# Patient Record
Sex: Female | Born: 1945 | Race: White | Hispanic: No | Marital: Married | Smoking: Never smoker
Health system: Southern US, Community
[De-identification: ages and names within clinical notes are randomized; demographics above are authoritative.]

## PROBLEM LIST (undated history)

## (undated) DIAGNOSIS — B009 Herpesviral infection, unspecified: Secondary | ICD-10-CM

## (undated) DIAGNOSIS — I639 Cerebral infarction, unspecified: Secondary | ICD-10-CM

## (undated) DIAGNOSIS — IMO0002 Reserved for concepts with insufficient information to code with codable children: Secondary | ICD-10-CM

## (undated) HISTORY — PX: TUBAL LIGATION: SHX77

## (undated) HISTORY — PX: DILATION AND CURETTAGE OF UTERUS: SHX78

## (undated) HISTORY — DX: Cerebral infarction, unspecified: I63.9

## (undated) HISTORY — DX: Reserved for concepts with insufficient information to code with codable children: IMO0002

## (undated) HISTORY — DX: Herpesviral infection, unspecified: B00.9

---

## 1998-12-16 ENCOUNTER — Encounter: Admission: RE | Admit: 1998-12-16 | Discharge: 1998-12-16 | Payer: Self-pay | Admitting: Unknown Physician Specialty

## 1998-12-16 ENCOUNTER — Encounter: Payer: Self-pay | Admitting: Unknown Physician Specialty

## 2000-12-11 ENCOUNTER — Other Ambulatory Visit: Admission: RE | Admit: 2000-12-11 | Discharge: 2000-12-11 | Payer: Self-pay | Admitting: *Deleted

## 2002-04-28 DIAGNOSIS — I639 Cerebral infarction, unspecified: Secondary | ICD-10-CM

## 2002-04-28 HISTORY — DX: Cerebral infarction, unspecified: I63.9

## 2002-06-08 ENCOUNTER — Emergency Department (HOSPITAL_COMMUNITY): Admission: EM | Admit: 2002-06-08 | Discharge: 2002-06-08 | Payer: Self-pay | Admitting: Emergency Medicine

## 2002-06-08 ENCOUNTER — Encounter: Payer: Self-pay | Admitting: Emergency Medicine

## 2002-08-07 ENCOUNTER — Other Ambulatory Visit: Admission: RE | Admit: 2002-08-07 | Discharge: 2002-08-07 | Payer: Self-pay | Admitting: Obstetrics and Gynecology

## 2002-08-15 ENCOUNTER — Encounter: Payer: Self-pay | Admitting: Obstetrics and Gynecology

## 2002-08-15 ENCOUNTER — Encounter: Admission: RE | Admit: 2002-08-15 | Discharge: 2002-08-15 | Payer: Self-pay | Admitting: Obstetrics and Gynecology

## 2003-11-09 ENCOUNTER — Other Ambulatory Visit: Admission: RE | Admit: 2003-11-09 | Discharge: 2003-11-09 | Payer: Self-pay | Admitting: Obstetrics and Gynecology

## 2003-11-24 ENCOUNTER — Ambulatory Visit (HOSPITAL_COMMUNITY): Admission: RE | Admit: 2003-11-24 | Discharge: 2003-11-24 | Payer: Self-pay | Admitting: Obstetrics and Gynecology

## 2004-08-09 ENCOUNTER — Encounter: Admission: RE | Admit: 2004-08-09 | Discharge: 2004-08-09 | Payer: Self-pay | Admitting: Obstetrics and Gynecology

## 2005-04-27 ENCOUNTER — Other Ambulatory Visit: Admission: RE | Admit: 2005-04-27 | Discharge: 2005-04-27 | Payer: Self-pay | Admitting: Obstetrics & Gynecology

## 2006-09-11 ENCOUNTER — Other Ambulatory Visit: Admission: RE | Admit: 2006-09-11 | Discharge: 2006-09-11 | Payer: Self-pay | Admitting: Obstetrics & Gynecology

## 2006-09-13 ENCOUNTER — Encounter: Admission: RE | Admit: 2006-09-13 | Discharge: 2006-09-13 | Payer: Self-pay | Admitting: Obstetrics and Gynecology

## 2007-11-26 ENCOUNTER — Other Ambulatory Visit: Admission: RE | Admit: 2007-11-26 | Discharge: 2007-11-26 | Payer: Self-pay | Admitting: Obstetrics and Gynecology

## 2007-12-04 ENCOUNTER — Encounter: Admission: RE | Admit: 2007-12-04 | Discharge: 2007-12-04 | Payer: Self-pay | Admitting: Obstetrics and Gynecology

## 2010-01-06 ENCOUNTER — Encounter: Admission: RE | Admit: 2010-01-06 | Discharge: 2010-01-06 | Payer: Self-pay | Admitting: Family Medicine

## 2010-07-29 ENCOUNTER — Other Ambulatory Visit: Payer: Self-pay | Admitting: Family Medicine

## 2010-07-29 DIAGNOSIS — Z1231 Encounter for screening mammogram for malignant neoplasm of breast: Secondary | ICD-10-CM

## 2010-08-08 ENCOUNTER — Ambulatory Visit
Admission: RE | Admit: 2010-08-08 | Discharge: 2010-08-08 | Disposition: A | Discharge status: Home / Self Care | Payer: PRIVATE HEALTH INSURANCE | Source: Ambulatory Visit | Attending: Family Medicine | Admitting: Family Medicine

## 2010-08-08 DIAGNOSIS — Z1231 Encounter for screening mammogram for malignant neoplasm of breast: Secondary | ICD-10-CM

## 2010-08-08 LAB — HM DEXA SCAN

## 2011-12-07 ENCOUNTER — Other Ambulatory Visit: Payer: Self-pay | Admitting: Family Medicine

## 2011-12-07 DIAGNOSIS — M810 Age-related osteoporosis without current pathological fracture: Secondary | ICD-10-CM

## 2011-12-07 DIAGNOSIS — Z1231 Encounter for screening mammogram for malignant neoplasm of breast: Secondary | ICD-10-CM

## 2012-01-05 ENCOUNTER — Other Ambulatory Visit: Payer: PRIVATE HEALTH INSURANCE

## 2012-01-05 ENCOUNTER — Ambulatory Visit: Payer: PRIVATE HEALTH INSURANCE

## 2012-01-23 ENCOUNTER — Other Ambulatory Visit: Payer: PRIVATE HEALTH INSURANCE

## 2012-01-23 ENCOUNTER — Other Ambulatory Visit: Payer: Self-pay | Admitting: Family Medicine

## 2012-01-23 ENCOUNTER — Ambulatory Visit
Admission: RE | Admit: 2012-01-23 | Discharge: 2012-01-23 | Disposition: A | Discharge status: Home / Self Care | Payer: Medicare Other | Source: Ambulatory Visit | Attending: Family Medicine | Admitting: Family Medicine

## 2012-01-23 DIAGNOSIS — Z1231 Encounter for screening mammogram for malignant neoplasm of breast: Secondary | ICD-10-CM

## 2012-12-09 ENCOUNTER — Encounter: Payer: Self-pay | Admitting: Nurse Practitioner

## 2012-12-10 ENCOUNTER — Ambulatory Visit (INDEPENDENT_AMBULATORY_CARE_PROVIDER_SITE_OTHER): Payer: MEDICARE | Admitting: Nurse Practitioner

## 2012-12-10 ENCOUNTER — Encounter: Payer: Self-pay | Admitting: Nurse Practitioner

## 2012-12-10 VITALS — BP 110/66 | HR 64 | Resp 16 | Ht 62.75 in | Wt 132.0 lb

## 2012-12-10 DIAGNOSIS — M810 Age-related osteoporosis without current pathological fracture: Secondary | ICD-10-CM

## 2012-12-10 DIAGNOSIS — Z01419 Encounter for gynecological examination (general) (routine) without abnormal findings: Secondary | ICD-10-CM

## 2012-12-10 DIAGNOSIS — Z0000 Encounter for general adult medical examination without abnormal findings: Secondary | ICD-10-CM

## 2012-12-10 NOTE — Patient Instructions (Signed)
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
## 2012-12-11 ENCOUNTER — Telehealth: Payer: Self-pay | Admitting: Nurse Practitioner

## 2012-12-11 NOTE — Telephone Encounter (Signed)
Patient called at 430 pm and left message requesting call back today even though she knew we would close at 5pm.  Advised receptionist to tell patient we would call today even after 5pm.  Call to patient at 529pm. VM confirms correct number. LMTCB. [facility]. Becky York already has order for BMD in computer.

## 2012-12-11 NOTE — Telephone Encounter (Signed)
Patient calling to see whether she is due for a bone density or not and where she had her last one?

## 2012-12-12 NOTE — Progress Notes (Signed)
Encounter reviewed by Dr. Brook Silva.  

## 2012-12-12 NOTE — Telephone Encounter (Signed)
Name: Becky York, DESA MRN: [id number]  Date: 12/17/2012 Status: Sch  Time: 9:30 AM  Length: 30   Visit Type: DG DEXA [150200]  Copay: $0.00  Provider: Windy Carina Dx Dexa 1 Department: [facility]  Referring Provider: Lauro Franklin CSN: [id number]  Notes: ORDER REQUESTED 10/16 PF: 08/08/2010 [facility] NO NEEDS CR/PT [employer]/ [facility]   Made On: Change Notes:  12/12/2012 10:27 AM 12/12/2012 10:43 AM  By: By:  Drenda Freeze    Appointment Orders

## 2012-12-12 NOTE — Telephone Encounter (Signed)
Patient has appointment scheduled for DEXA that was made today 10/16 by patient.

## 2012-12-17 ENCOUNTER — Other Ambulatory Visit: Payer: Self-pay

## 2012-12-17 ENCOUNTER — Ambulatory Visit
Admission: RE | Admit: 2012-12-17 | Discharge: 2012-12-17 | Disposition: A | Discharge status: Home / Self Care | Payer: Medicare Other | Source: Ambulatory Visit | Attending: Nurse Practitioner | Admitting: Nurse Practitioner

## 2012-12-17 DIAGNOSIS — M810 Age-related osteoporosis without current pathological fracture: Secondary | ICD-10-CM

## 2012-12-17 DIAGNOSIS — Z1231 Encounter for screening mammogram for malignant neoplasm of breast: Secondary | ICD-10-CM

## 2012-12-31 ENCOUNTER — Telehealth: Payer: Self-pay | Admitting: *Deleted

## 2012-12-31 NOTE — Telephone Encounter (Signed)
I have attempted to contact this patient by phone with the following results: left message to return my call on answering machine (home).  

## 2012-12-31 NOTE — Telephone Encounter (Signed)
Message copied by Luisa Dago on Tue Dec 31, 2012  9:56 AM ------      Message from: Ria Comment R      Created: Mon Dec 30, 2012 10:19 AM       Let patient know that BMD did show no significant changes in the spine and left hip compared to 2009.  Since BMD in 2012 there was a slight loss at the left hip.  She needs to be sure and get more walking in. She will need to discuss this with Dr. Clarene Duke in January as planned. ------

## 2012-12-31 NOTE — Telephone Encounter (Signed)
Message copied by Alisa Graff on Tue Dec 31, 2012  3:20 PM ------      Message from: Ria Comment R      Created: Mon Dec 30, 2012 10:19 AM       Let patient know that BMD did show no significant changes in the spine and left hip compared to 2009.  Since BMD in 2012 there was a slight loss at the left hip.  She needs to be sure and get more walking in. She will need to discuss this with Dr. Clarene Duke in January as planned. ------

## 2012-12-31 NOTE — Telephone Encounter (Signed)
Patient returning call regarding BMD results and is very upset that our office has taken 2 weeks to call with these results when dr Little's office called her two days after test.  She is also very upset that we only called once and that Dr Fredirick Maudlin office called four times until they reached her.  She reports "she is not just a number, she is a person" and she doesn't like that we make her feel like a number.  She states she wants to continue to see Alexia Freestone but she doesn't trust our clinical staff and wants to let Dr Clarene Duke take care of her osteoporosis.  Acknowledged patient's frustration and apologized for delay in results.  Since patient has been a long time patient of [facility] and as she said, has not been made to feel like a number before, perhaps she could understand that our office is going through some changes and we are working to correct them and she could give clinical staff another chance. Then patient states this has happened once before in our old office (prior to 03/2008 which is when we moved here) and that she talked with Alexia Freestone about her concerns and Alexia Freestone took care of it.  Again apologized to patient and advised we are working on ways to improve the process.  Patient states she will see patty but will handle this with Dr Clarene Duke. Please see phone note from 10-15 and 12-12-12 where we previously responded to patient requests promptly.  Chart routed to [facility] and Dr Hyacinth Meeker for review.

## 2013-01-06 NOTE — Telephone Encounter (Signed)
Discussed with PG this encounter.  She has known patient for many years and states this is typical for patient.  Feels they have good relationship.  PG states we ordered this for patient as a convenience and that she was aware patient already knew results.  We just called as a follow-up courtesy.  Patient wasn't waiting on [place] for results.  PG will try not to order other tests for providers in future with this patient so we are not in similar situation in the future.  We want to try and meet all of our patient's expectations if possible.  PG aware we did not in this situation.  No follow-up needed.  Encounter closed.

## 2013-01-27 ENCOUNTER — Ambulatory Visit
Admission: RE | Admit: 2013-01-27 | Discharge: 2013-01-27 | Disposition: A | Discharge status: Home / Self Care | Payer: Medicare Other | Source: Ambulatory Visit

## 2013-01-27 DIAGNOSIS — Z1231 Encounter for screening mammogram for malignant neoplasm of breast: Secondary | ICD-10-CM

## 2013-10-10 DIAGNOSIS — J329 Chronic sinusitis, unspecified: Secondary | ICD-10-CM | POA: Diagnosis not present

## 2013-11-05 DIAGNOSIS — Z1211 Encounter for screening for malignant neoplasm of colon: Secondary | ICD-10-CM | POA: Diagnosis not present

## 2013-11-05 DIAGNOSIS — M810 Age-related osteoporosis without current pathological fracture: Secondary | ICD-10-CM | POA: Diagnosis not present

## 2013-11-05 DIAGNOSIS — Z23 Encounter for immunization: Secondary | ICD-10-CM | POA: Diagnosis not present

## 2013-11-05 DIAGNOSIS — Z87442 Personal history of urinary calculi: Secondary | ICD-10-CM | POA: Diagnosis not present

## 2013-11-05 DIAGNOSIS — Z0000 Encounter for general adult medical examination without abnormal findings: Secondary | ICD-10-CM | POA: Diagnosis not present

## 2013-11-05 DIAGNOSIS — E559 Vitamin D deficiency, unspecified: Secondary | ICD-10-CM | POA: Diagnosis not present

## 2013-11-05 DIAGNOSIS — Z79899 Other long term (current) drug therapy: Secondary | ICD-10-CM | POA: Diagnosis not present

## 2013-12-11 ENCOUNTER — Ambulatory Visit: Payer: MEDICARE | Admitting: Nurse Practitioner

## 2013-12-29 ENCOUNTER — Encounter: Payer: Self-pay | Admitting: Nurse Practitioner

## 2014-01-20 ENCOUNTER — Ambulatory Visit: Payer: MEDICARE | Admitting: Nurse Practitioner

## 2014-03-12 ENCOUNTER — Ambulatory Visit (INDEPENDENT_AMBULATORY_CARE_PROVIDER_SITE_OTHER): Payer: Medicare Other | Admitting: Nurse Practitioner

## 2014-03-12 ENCOUNTER — Encounter: Payer: Self-pay | Admitting: Nurse Practitioner

## 2014-03-12 VITALS — BP 116/72 | HR 60 | Ht 62.75 in | Wt 130.0 lb

## 2014-03-12 DIAGNOSIS — Z124 Encounter for screening for malignant neoplasm of cervix: Secondary | ICD-10-CM

## 2014-03-12 DIAGNOSIS — Z01419 Encounter for gynecological examination (general) (routine) without abnormal findings: Secondary | ICD-10-CM | POA: Diagnosis not present

## 2014-03-12 DIAGNOSIS — Z1211 Encounter for screening for malignant neoplasm of colon: Secondary | ICD-10-CM | POA: Diagnosis not present
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
## 2014-03-12 NOTE — Patient Instructions (Addendum)

## 2014-03-15 NOTE — Progress Notes (Signed)
Encounter reviewed by Dr. Janssen Zee Silva.  

## 2014-06-01 ENCOUNTER — Telehealth: Payer: Self-pay | Admitting: Nurse Practitioner

## 2014-06-01 NOTE — Telephone Encounter (Signed)
Pt says both breast are tender and swollen.

## 2014-06-01 NOTE — Telephone Encounter (Signed)
Spoke with patient. Patient states that last week she began to have bilateral breast tenderness. "I feel like it may be due to drinking too much caffeine. They are hurting off and on and they feel hard at the top of my breast. I also notice that they are swollen when I put my bra on." Advised patient will need to be seen for office visit with Milford Cage, FNP. Patient is agreeable. Appointment scheduled for tomorrow 4/5 at 10:30am. Agreeable to date and time.  Routing to provider for final review. Patient agreeable to disposition. Will close encounter

## 2014-06-02 ENCOUNTER — Ambulatory Visit (INDEPENDENT_AMBULATORY_CARE_PROVIDER_SITE_OTHER): Payer: Medicare Other | Admitting: Nurse Practitioner

## 2014-06-02 ENCOUNTER — Encounter: Payer: Self-pay | Admitting: Nurse Practitioner

## 2014-06-02 VITALS — BP 124/76 | HR 78 | Ht 62.75 in | Wt 130.0 lb

## 2014-06-02 DIAGNOSIS — N644 Mastodynia: Secondary | ICD-10-CM

## 2014-06-02 NOTE — Progress Notes (Signed)
Becky York  DOB: October 11, 2045  MRN: [id number]  Subjective:   69 y.o. Married white female G1P1 presents for evaluation of bilateral breast tenderness. Onset of the symptoms was about a month ago but recently noted an increase after doing yard work.  Patient sought evaluation because of breast tenderness.  Contributing factors include no family history of breast cancer. Denies anorexia, chills, fatigue, fevers and malaise. Patient denies history of trauma, bites, or injuries. Last mammogram was over a year ago.  Previous evaluation has included mammograms only.  She reports that she has had some increase emotional issues over husbands PTSD.  She has had a change in appetite but has tried to maintain hydration with increasing her caffeine.  Then she and husband did some outside work in the yard for 3 days with a lot of raking and pulling.  This caused the increase in pain upper breast.  She denies nipple discharge or mass.   Review of Systems A comprehensive review of systems was negative.   Objective:   General appearance: alert, cooperative, appears stated age and no distress, no rash Head: Normocephalic, without obvious abnormality, atraumatic Neck: no adenopathy and thyroid not enlarged, symmetric, no tenderness/mass/nodules Back: symmetric, no curvature. ROM normal. No CVA tenderness. Lungs: clear to auscultation bilaterally Breasts: normal appearance, no masses or tenderness, positive findings: fibrocystic changes and tenderness with both breast mostly upper outer quadrants. Heart: regular rate and rhythm Abdomen: normal findings: no masses palpable and no organomegaly    Assessment:   ASSESSMENT:Patient is diagnosed with mastalgia, likely due to fibrocystic changes   Plan:   PLAN: The patient has a documented plan to follow with further care of bilateral diagnostic mammogram with 3 D and [place] if needed on 06/04/14. 2. PLAN: FOLLOW as needed

## 2014-06-02 NOTE — Progress Notes (Signed)
Patient is scheduled for Bilateral Breast 3D Diagnostic Mammogram and bilateral  Breast Ultrasound at [facility] on 06/04/14 at 1300 . Patient agreeable to time/date/location.

## 2014-06-04 ENCOUNTER — Ambulatory Visit
Admission: RE | Admit: 2014-06-04 | Discharge: 2014-06-04 | Disposition: A | Discharge status: Home / Self Care | Payer: Medicare Other | Source: Ambulatory Visit | Attending: Nurse Practitioner | Admitting: Nurse Practitioner

## 2014-06-04 DIAGNOSIS — N644 Mastodynia: Secondary | ICD-10-CM

## 2014-06-04 DIAGNOSIS — N6459 Other signs and symptoms in breast: Secondary | ICD-10-CM | POA: Diagnosis not present

## 2014-06-07 NOTE — Progress Notes (Signed)
Encounter reviewed by Dr. Brook Silva.  

## 2014-07-07 DIAGNOSIS — M545 Low back pain: Secondary | ICD-10-CM | POA: Diagnosis not present

## 2014-07-07 DIAGNOSIS — R300 Dysuria: Secondary | ICD-10-CM | POA: Diagnosis not present

## 2014-07-07 DIAGNOSIS — R8290 Unspecified abnormal findings in urine: Secondary | ICD-10-CM | POA: Diagnosis not present

## 2014-11-13 DIAGNOSIS — Z87442 Personal history of urinary calculi: Secondary | ICD-10-CM | POA: Diagnosis not present

## 2014-11-13 DIAGNOSIS — M810 Age-related osteoporosis without current pathological fracture: Secondary | ICD-10-CM | POA: Diagnosis not present

## 2014-11-13 DIAGNOSIS — E559 Vitamin D deficiency, unspecified: Secondary | ICD-10-CM | POA: Diagnosis not present

## 2014-11-13 DIAGNOSIS — Z0000 Encounter for general adult medical examination without abnormal findings: Secondary | ICD-10-CM | POA: Diagnosis not present

## 2014-11-13 DIAGNOSIS — Z79899 Other long term (current) drug therapy: Secondary | ICD-10-CM | POA: Diagnosis not present

## 2014-11-13 DIAGNOSIS — Z23 Encounter for immunization: Secondary | ICD-10-CM | POA: Diagnosis not present

## 2014-12-19 ENCOUNTER — Other Ambulatory Visit: Payer: Self-pay | Admitting: Family Medicine

## 2014-12-19 DIAGNOSIS — M810 Age-related osteoporosis without current pathological fracture: Secondary | ICD-10-CM

## 2015-02-12 ENCOUNTER — Ambulatory Visit
Admission: RE | Admit: 2015-02-12 | Discharge: 2015-02-12 | Disposition: A | Discharge status: Home / Self Care | Payer: Medicare Other | Source: Ambulatory Visit | Attending: Family Medicine | Admitting: Family Medicine

## 2015-02-12 DIAGNOSIS — M810 Age-related osteoporosis without current pathological fracture: Secondary | ICD-10-CM

## 2015-04-01 DIAGNOSIS — H04123 Dry eye syndrome of bilateral lacrimal glands: Secondary | ICD-10-CM | POA: Diagnosis not present

## 2015-04-01 DIAGNOSIS — H40033 Anatomical narrow angle, bilateral: Secondary | ICD-10-CM | POA: Diagnosis not present

## 2015-04-07 ENCOUNTER — Ambulatory Visit (INDEPENDENT_AMBULATORY_CARE_PROVIDER_SITE_OTHER): Payer: Medicare Other | Admitting: Nurse Practitioner

## 2015-04-07 ENCOUNTER — Encounter: Payer: Self-pay | Admitting: Nurse Practitioner

## 2015-04-07 VITALS — BP 100/70 | HR 76 | Resp 14 | Ht 63.5 in | Wt 130.6 lb

## 2015-04-07 DIAGNOSIS — Z124 Encounter for screening for malignant neoplasm of cervix: Secondary | ICD-10-CM | POA: Diagnosis not present

## 2015-04-07 DIAGNOSIS — Z01419 Encounter for gynecological examination (general) (routine) without abnormal findings: Secondary | ICD-10-CM

## 2015-04-07 DIAGNOSIS — Z0000 Encounter for general adult medical examination without abnormal findings: Secondary | ICD-10-CM

## 2015-04-07 DIAGNOSIS — E559 Vitamin D deficiency, unspecified: Secondary | ICD-10-CM

## 2015-04-07 NOTE — Progress Notes (Signed)
Encounter reviewed by Dr. Brook Amundson C. Silva.  
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
## 2015-04-07 NOTE — Patient Instructions (Signed)

## 2015-04-08 LAB — HEPATITIS C ANTIBODY: HCV Ab: NEGATIVE

## 2015-04-08 LAB — VITAMIN D 25 HYDROXY (VIT D DEFICIENCY, FRACTURES): Vit D, 25-Hydroxy: 31 ng/mL (ref 30–100)

## 2015-04-12 DIAGNOSIS — Z1211 Encounter for screening for malignant neoplasm of colon: Secondary | ICD-10-CM | POA: Diagnosis not present

## 2015-04-12 DIAGNOSIS — Z1212 Encounter for screening for malignant neoplasm of rectum: Secondary | ICD-10-CM | POA: Diagnosis not present

## 2015-04-26 ENCOUNTER — Telehealth: Payer: Self-pay | Admitting: Nurse Practitioner

## 2015-04-26 NOTE — Telephone Encounter (Signed)
LMTCB about insurance card

## 2015-05-28 DIAGNOSIS — R195 Other fecal abnormalities: Secondary | ICD-10-CM | POA: Diagnosis not present

## 2015-07-09 DIAGNOSIS — K5730 Diverticulosis of large intestine without perforation or abscess without bleeding: Secondary | ICD-10-CM | POA: Diagnosis not present

## 2015-07-09 DIAGNOSIS — D126 Benign neoplasm of colon, unspecified: Secondary | ICD-10-CM | POA: Diagnosis not present

## 2015-07-09 DIAGNOSIS — D122 Benign neoplasm of ascending colon: Secondary | ICD-10-CM | POA: Diagnosis not present

## 2015-07-09 DIAGNOSIS — R195 Other fecal abnormalities: Secondary | ICD-10-CM | POA: Diagnosis not present

## 2015-07-09 DIAGNOSIS — D125 Benign neoplasm of sigmoid colon: Secondary | ICD-10-CM | POA: Diagnosis not present

## 2015-12-15 DIAGNOSIS — Z87442 Personal history of urinary calculi: Secondary | ICD-10-CM | POA: Diagnosis not present

## 2015-12-15 DIAGNOSIS — R899 Unspecified abnormal finding in specimens from other organs, systems and tissues: Secondary | ICD-10-CM | POA: Diagnosis not present

## 2015-12-15 DIAGNOSIS — Z1389 Encounter for screening for other disorder: Secondary | ICD-10-CM | POA: Diagnosis not present

## 2015-12-15 DIAGNOSIS — E559 Vitamin D deficiency, unspecified: Secondary | ICD-10-CM | POA: Diagnosis not present

## 2015-12-15 DIAGNOSIS — Z79899 Other long term (current) drug therapy: Secondary | ICD-10-CM | POA: Diagnosis not present

## 2015-12-15 DIAGNOSIS — Z86010 Personal history of colonic polyps: Secondary | ICD-10-CM | POA: Diagnosis not present

## 2015-12-15 DIAGNOSIS — Z0000 Encounter for general adult medical examination without abnormal findings: Secondary | ICD-10-CM | POA: Diagnosis not present

## 2015-12-15 DIAGNOSIS — M810 Age-related osteoporosis without current pathological fracture: Secondary | ICD-10-CM | POA: Diagnosis not present

## 2015-12-15 DIAGNOSIS — Z23 Encounter for immunization: Secondary | ICD-10-CM | POA: Diagnosis not present

## 2016-04-07 ENCOUNTER — Ambulatory Visit: Payer: Medicare Other | Admitting: Nurse Practitioner

## 2016-04-10 DIAGNOSIS — H40033 Anatomical narrow angle, bilateral: Secondary | ICD-10-CM | POA: Diagnosis not present

## 2016-04-10 DIAGNOSIS — H2513 Age-related nuclear cataract, bilateral: Secondary | ICD-10-CM | POA: Diagnosis not present

## 2016-06-16 DIAGNOSIS — R071 Chest pain on breathing: Secondary | ICD-10-CM | POA: Diagnosis not present

## 2016-06-20 ENCOUNTER — Other Ambulatory Visit: Payer: Self-pay | Admitting: Family Medicine

## 2016-06-20 DIAGNOSIS — Z1231 Encounter for screening mammogram for malignant neoplasm of breast: Secondary | ICD-10-CM

## 2016-07-05 ENCOUNTER — Ambulatory Visit
Admission: RE | Admit: 2016-07-05 | Discharge: 2016-07-05 | Disposition: A | Discharge status: Home / Self Care | Payer: Medicare Other | Source: Ambulatory Visit | Attending: Family Medicine | Admitting: Family Medicine

## 2016-07-05 DIAGNOSIS — Z1231 Encounter for screening mammogram for malignant neoplasm of breast: Secondary | ICD-10-CM | POA: Diagnosis not present

## 2016-09-25 DIAGNOSIS — M546 Pain in thoracic spine: Secondary | ICD-10-CM | POA: Diagnosis not present

## 2016-10-02 ENCOUNTER — Ambulatory Visit
Admission: RE | Admit: 2016-10-02 | Discharge: 2016-10-02 | Disposition: A | Discharge status: Home / Self Care | Payer: Medicare Other | Source: Ambulatory Visit | Attending: Family Medicine | Admitting: Family Medicine

## 2016-10-02 ENCOUNTER — Other Ambulatory Visit: Payer: Self-pay | Admitting: Family Medicine

## 2016-10-02 DIAGNOSIS — M546 Pain in thoracic spine: Principal | ICD-10-CM

## 2016-10-02 DIAGNOSIS — M47814 Spondylosis without myelopathy or radiculopathy, thoracic region: Secondary | ICD-10-CM | POA: Diagnosis not present

## 2016-10-02 DIAGNOSIS — G8929 Other chronic pain: Secondary | ICD-10-CM

## 2016-10-03 ENCOUNTER — Other Ambulatory Visit: Payer: Self-pay | Admitting: Family Medicine

## 2016-10-03 DIAGNOSIS — M8580 Other specified disorders of bone density and structure, unspecified site: Secondary | ICD-10-CM

## 2016-10-03 DIAGNOSIS — M810 Age-related osteoporosis without current pathological fracture: Secondary | ICD-10-CM

## 2016-11-30 DIAGNOSIS — M62830 Muscle spasm of back: Secondary | ICD-10-CM | POA: Diagnosis not present

## 2016-12-21 DIAGNOSIS — Z23 Encounter for immunization: Secondary | ICD-10-CM | POA: Diagnosis not present

## 2016-12-21 DIAGNOSIS — Z87442 Personal history of urinary calculi: Secondary | ICD-10-CM | POA: Diagnosis not present

## 2016-12-21 DIAGNOSIS — Z0000 Encounter for general adult medical examination without abnormal findings: Secondary | ICD-10-CM | POA: Diagnosis not present

## 2016-12-21 DIAGNOSIS — Z79899 Other long term (current) drug therapy: Secondary | ICD-10-CM | POA: Diagnosis not present

## 2016-12-21 DIAGNOSIS — Z86010 Personal history of colonic polyps: Secondary | ICD-10-CM | POA: Diagnosis not present

## 2016-12-21 DIAGNOSIS — E559 Vitamin D deficiency, unspecified: Secondary | ICD-10-CM | POA: Diagnosis not present

## 2016-12-21 DIAGNOSIS — M810 Age-related osteoporosis without current pathological fracture: Secondary | ICD-10-CM | POA: Diagnosis not present

## 2017-02-13 ENCOUNTER — Other Ambulatory Visit: Payer: Medicare Other

## 2017-05-09 DIAGNOSIS — M810 Age-related osteoporosis without current pathological fracture: Secondary | ICD-10-CM | POA: Diagnosis not present

## 2017-05-09 DIAGNOSIS — M545 Low back pain: Secondary | ICD-10-CM | POA: Diagnosis not present

## 2017-05-11 DIAGNOSIS — M549 Dorsalgia, unspecified: Secondary | ICD-10-CM | POA: Diagnosis not present

## 2017-06-20 ENCOUNTER — Other Ambulatory Visit: Payer: Self-pay | Admitting: Family Medicine

## 2017-06-20 DIAGNOSIS — Z139 Encounter for screening, unspecified: Secondary | ICD-10-CM

## 2017-10-02 ENCOUNTER — Other Ambulatory Visit: Payer: Self-pay | Admitting: Family Medicine

## 2017-10-02 DIAGNOSIS — M810 Age-related osteoporosis without current pathological fracture: Secondary | ICD-10-CM

## 2017-10-16 DIAGNOSIS — M545 Low back pain: Secondary | ICD-10-CM | POA: Diagnosis not present

## 2018-01-11 DIAGNOSIS — Z79899 Other long term (current) drug therapy: Secondary | ICD-10-CM | POA: Diagnosis not present

## 2018-01-11 DIAGNOSIS — M810 Age-related osteoporosis without current pathological fracture: Secondary | ICD-10-CM | POA: Diagnosis not present

## 2018-01-11 DIAGNOSIS — Z0000 Encounter for general adult medical examination without abnormal findings: Secondary | ICD-10-CM | POA: Diagnosis not present

## 2018-01-11 DIAGNOSIS — N289 Disorder of kidney and ureter, unspecified: Secondary | ICD-10-CM | POA: Diagnosis not present

## 2018-01-11 DIAGNOSIS — Z86010 Personal history of colonic polyps: Secondary | ICD-10-CM | POA: Diagnosis not present

## 2018-01-11 DIAGNOSIS — Z23 Encounter for immunization: Secondary | ICD-10-CM | POA: Diagnosis not present

## 2018-01-11 DIAGNOSIS — M899 Disorder of bone, unspecified: Secondary | ICD-10-CM | POA: Diagnosis not present

## 2018-03-19 DIAGNOSIS — R0789 Other chest pain: Secondary | ICD-10-CM | POA: Diagnosis not present

## 2018-03-19 DIAGNOSIS — S29011A Strain of muscle and tendon of front wall of thorax, initial encounter: Secondary | ICD-10-CM | POA: Diagnosis not present

## 2018-03-25 DIAGNOSIS — S41102A Unspecified open wound of left upper arm, initial encounter: Secondary | ICD-10-CM | POA: Diagnosis not present

## 2018-06-18 IMAGING — CR DG THORACIC SPINE 3V
3 series · 3 of 3 positions shown · non-contrast
Comparison: 02/14/2012

CLINICAL DATA: Thoracic back pain

EXAM:
THORACIC SPINE - 3 VIEWS

[t t-spine a.p.]
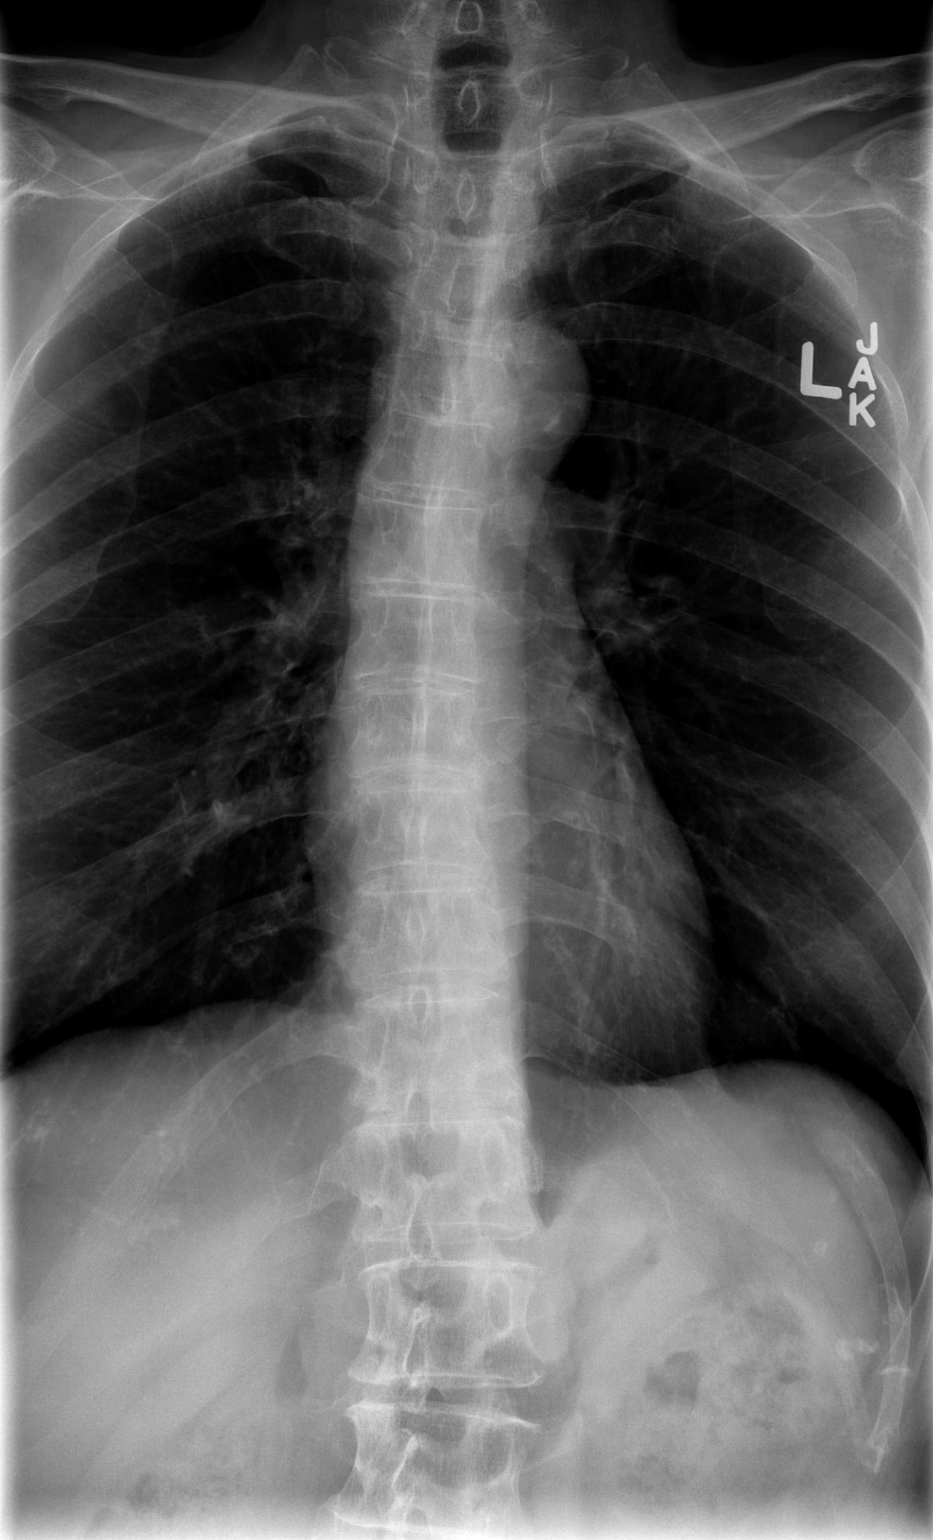

[t t-spine lat *]
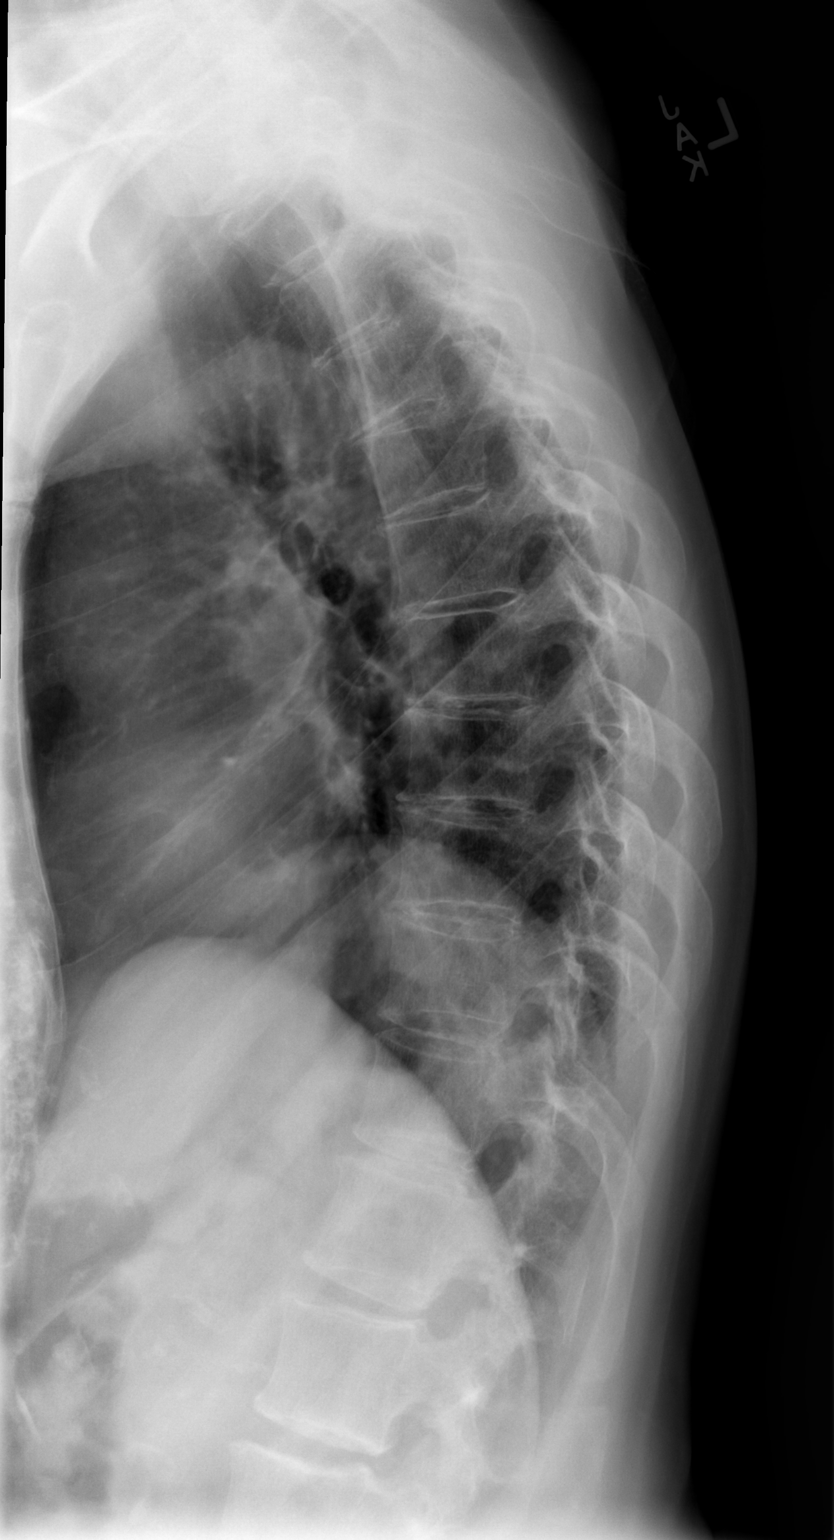

[t swimmers]
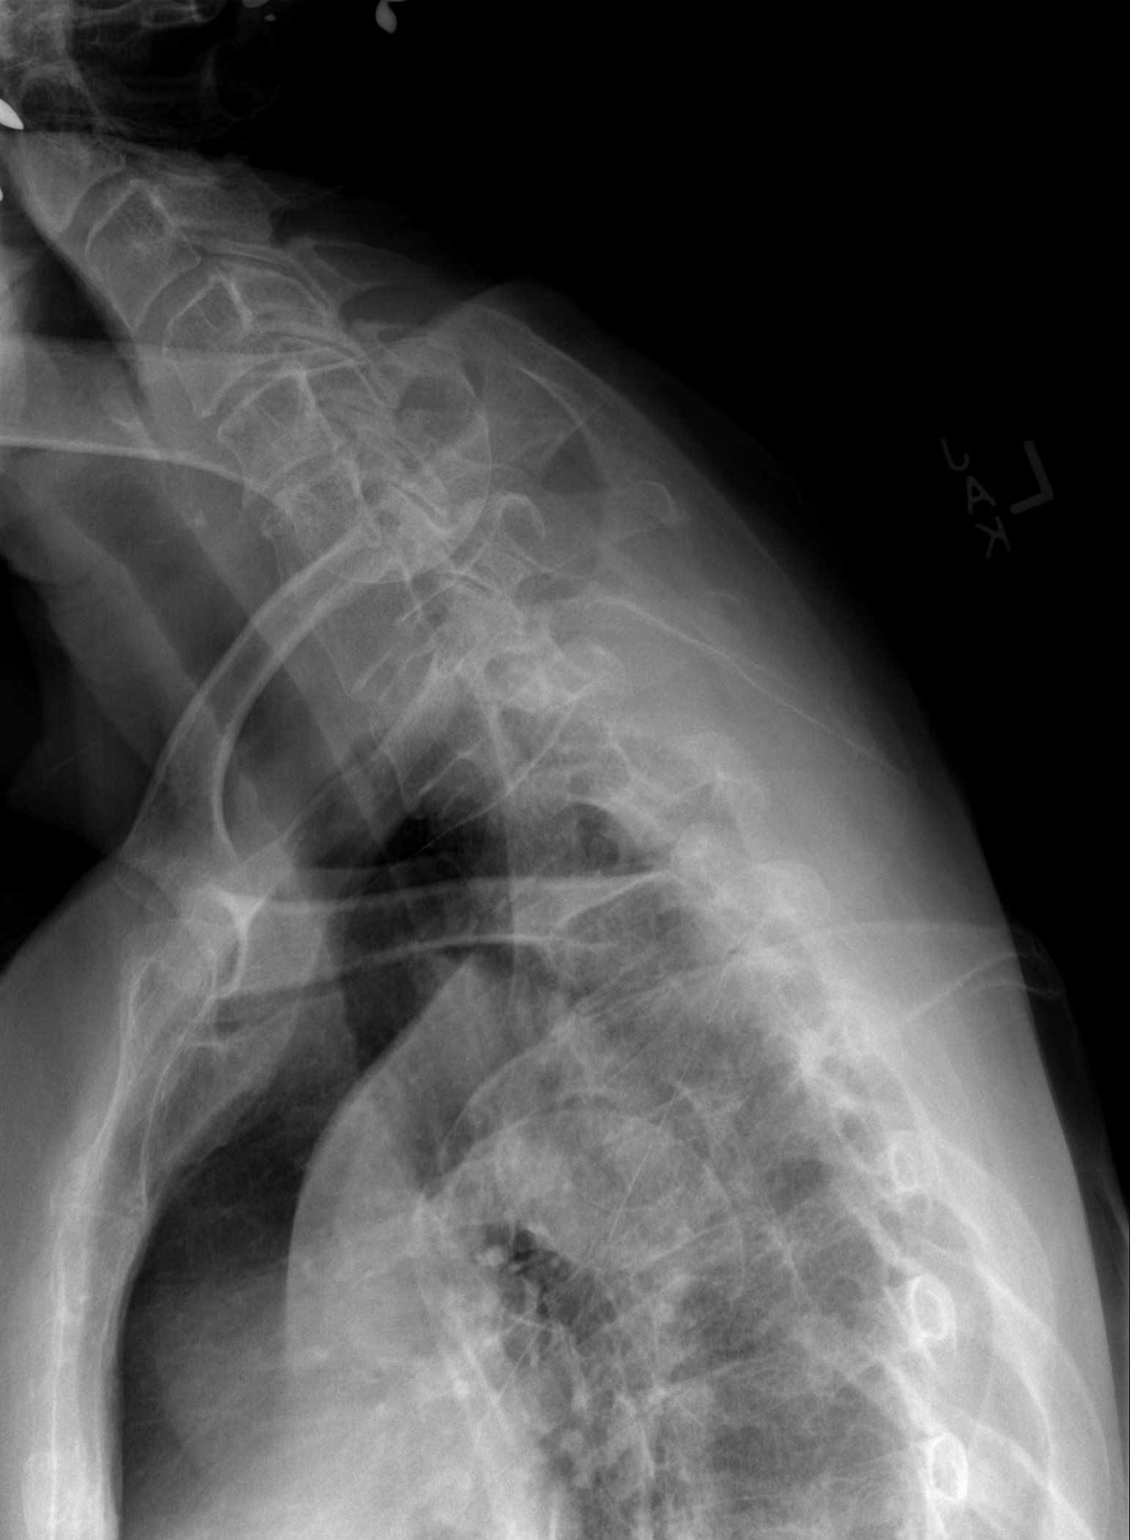

[3 of 3 positions shown; findings below may reference images not displayed]

FINDINGS: There is slight dextroscoliosis of the midthoracic spine there is no
vertebral compression deformity. Mild spondylitic changes in the
midthoracic spine. No definite fracture.
IMPRESSION: No acute bony pathology.

## 2018-06-27 DIAGNOSIS — S29012A Strain of muscle and tendon of back wall of thorax, initial encounter: Secondary | ICD-10-CM | POA: Diagnosis not present

## 2018-11-18 DIAGNOSIS — Z23 Encounter for immunization: Secondary | ICD-10-CM | POA: Diagnosis not present

## 2019-04-24 ENCOUNTER — Ambulatory Visit: Payer: Medicare Other | Attending: Internal Medicine

## 2019-04-24 DIAGNOSIS — Z23 Encounter for immunization: Secondary | ICD-10-CM | POA: Insufficient documentation

## 2019-04-24 NOTE — Progress Notes (Signed)
   Covid-19 Vaccination Clinic  Name:  Becky York    MRN: [id number] DOB: 27-May-1945  04/24/2019  [name] was observed post Covid-19 immunization for 15 minutes without incidence. She was provided with Vaccine Information Sheet and instruction to access the V-Safe system.   [name] was instructed to call 911 with any severe reactions post vaccine: Marland Kitchen Difficulty breathing  . Swelling of your face and throat  . A fast heartbeat  . A bad rash all over your body  . Dizziness and weakness    Immunizations Administered    Name Date Dose VIS Date Route   Pfizer COVID-19 Vaccine 04/24/2019  8:19 AM 0.3 mL 02/07/2019 Intramuscular   Manufacturer: [place]   Lot: [id number]   [place]: [id number]

## 2019-05-14 ENCOUNTER — Ambulatory Visit: Payer: Medicare Other | Attending: Internal Medicine

## 2019-05-14 DIAGNOSIS — Z23 Encounter for immunization: Secondary | ICD-10-CM

## 2019-05-14 NOTE — Progress Notes (Signed)
   Covid-19 Vaccination Clinic  Name:  Becky York    MRN: [id number] DOB: [birth year]  05/14/2019  [name] was observed post Covid-19 immunization for 15 minutes without incident. She was provided with Vaccine Information Sheet and instruction to access the V-Safe system.   [name] was instructed to call 911 with any severe reactions post vaccine: Marland Kitchen Difficulty breathing  . Swelling of face and throat  . A fast heartbeat  . A bad rash all over body  . Dizziness and weakness   Immunizations Administered    Name Date Dose VIS Date Route   Pfizer COVID-19 Vaccine 05/14/2019 10:00 AM 0.3 mL 02/07/2019 Intramuscular   Manufacturer: [place]   Lot: [id number]   [place]: [id number]

## 2019-11-17 DIAGNOSIS — M545 Low back pain: Secondary | ICD-10-CM | POA: Diagnosis not present

## 2019-11-17 DIAGNOSIS — R300 Dysuria: Secondary | ICD-10-CM | POA: Diagnosis not present

## 2020-01-02 DIAGNOSIS — Z23 Encounter for immunization: Secondary | ICD-10-CM | POA: Diagnosis not present

## 2020-02-26 DIAGNOSIS — Z23 Encounter for immunization: Secondary | ICD-10-CM | POA: Diagnosis not present

## 2020-03-10 DIAGNOSIS — E7800 Pure hypercholesterolemia, unspecified: Secondary | ICD-10-CM | POA: Diagnosis not present

## 2020-03-10 DIAGNOSIS — N183 Chronic kidney disease, stage 3 unspecified: Secondary | ICD-10-CM | POA: Diagnosis not present

## 2020-03-10 DIAGNOSIS — Z0000 Encounter for general adult medical examination without abnormal findings: Secondary | ICD-10-CM | POA: Diagnosis not present

## 2020-03-10 DIAGNOSIS — Z79899 Other long term (current) drug therapy: Secondary | ICD-10-CM | POA: Diagnosis not present

## 2020-03-10 DIAGNOSIS — Z86010 Personal history of colonic polyps: Secondary | ICD-10-CM | POA: Diagnosis not present

## 2020-03-10 DIAGNOSIS — M899 Disorder of bone, unspecified: Secondary | ICD-10-CM | POA: Diagnosis not present

## 2020-03-10 DIAGNOSIS — Z87442 Personal history of urinary calculi: Secondary | ICD-10-CM | POA: Diagnosis not present

## 2020-03-10 DIAGNOSIS — R8290 Unspecified abnormal findings in urine: Secondary | ICD-10-CM | POA: Diagnosis not present

## 2020-03-10 DIAGNOSIS — M810 Age-related osteoporosis without current pathological fracture: Secondary | ICD-10-CM | POA: Diagnosis not present

## 2020-03-10 DIAGNOSIS — M549 Dorsalgia, unspecified: Secondary | ICD-10-CM | POA: Diagnosis not present

## 2020-03-16 ENCOUNTER — Other Ambulatory Visit: Payer: Self-pay | Admitting: Family Medicine

## 2020-03-16 DIAGNOSIS — M810 Age-related osteoporosis without current pathological fracture: Secondary | ICD-10-CM

## 2020-03-16 DIAGNOSIS — Z1231 Encounter for screening mammogram for malignant neoplasm of breast: Secondary | ICD-10-CM

## 2020-06-08 DIAGNOSIS — M4316 Spondylolisthesis, lumbar region: Secondary | ICD-10-CM | POA: Diagnosis not present

## 2020-06-08 DIAGNOSIS — M546 Pain in thoracic spine: Secondary | ICD-10-CM | POA: Diagnosis not present

## 2020-06-08 DIAGNOSIS — M47816 Spondylosis without myelopathy or radiculopathy, lumbar region: Secondary | ICD-10-CM | POA: Diagnosis not present

## 2020-06-08 DIAGNOSIS — M47814 Spondylosis without myelopathy or radiculopathy, thoracic region: Secondary | ICD-10-CM | POA: Diagnosis not present

## 2020-06-08 DIAGNOSIS — M4150 Other secondary scoliosis, site unspecified: Secondary | ICD-10-CM | POA: Diagnosis not present

## 2020-07-12 ENCOUNTER — Ambulatory Visit
Admission: RE | Admit: 2020-07-12 | Discharge: 2020-07-12 | Disposition: A | Discharge status: Home / Self Care | Payer: Medicare Other | Source: Ambulatory Visit | Attending: Family Medicine | Admitting: Family Medicine

## 2020-07-12 ENCOUNTER — Other Ambulatory Visit: Payer: Self-pay

## 2020-07-12 DIAGNOSIS — M810 Age-related osteoporosis without current pathological fracture: Secondary | ICD-10-CM

## 2020-07-12 DIAGNOSIS — Z780 Asymptomatic menopausal state: Secondary | ICD-10-CM | POA: Diagnosis not present

## 2020-07-12 DIAGNOSIS — Z1231 Encounter for screening mammogram for malignant neoplasm of breast: Secondary | ICD-10-CM

## 2020-07-13 ENCOUNTER — Other Ambulatory Visit: Payer: Self-pay | Admitting: *Deleted

## 2020-07-13 ENCOUNTER — Other Ambulatory Visit: Payer: Self-pay | Admitting: Family Medicine

## 2020-07-13 DIAGNOSIS — R928 Other abnormal and inconclusive findings on diagnostic imaging of breast: Secondary | ICD-10-CM

## 2020-07-22 DIAGNOSIS — R0989 Other specified symptoms and signs involving the circulatory and respiratory systems: Secondary | ICD-10-CM | POA: Diagnosis not present

## 2020-07-22 DIAGNOSIS — J040 Acute laryngitis: Secondary | ICD-10-CM | POA: Diagnosis not present

## 2020-07-22 DIAGNOSIS — Z03818 Encounter for observation for suspected exposure to other biological agents ruled out: Secondary | ICD-10-CM | POA: Diagnosis not present

## 2020-07-22 DIAGNOSIS — J029 Acute pharyngitis, unspecified: Secondary | ICD-10-CM | POA: Diagnosis not present

## 2020-07-22 DIAGNOSIS — R059 Cough, unspecified: Secondary | ICD-10-CM | POA: Diagnosis not present

## 2020-08-02 ENCOUNTER — Other Ambulatory Visit: Payer: Medicare Other

## 2020-08-19 ENCOUNTER — Ambulatory Visit
Admission: RE | Admit: 2020-08-19 | Discharge: 2020-08-19 | Disposition: A | Discharge status: Home / Self Care | Payer: Medicare Other | Source: Ambulatory Visit | Attending: Family Medicine | Admitting: Family Medicine

## 2020-08-19 ENCOUNTER — Other Ambulatory Visit: Payer: Self-pay

## 2020-08-19 DIAGNOSIS — R928 Other abnormal and inconclusive findings on diagnostic imaging of breast: Secondary | ICD-10-CM

## 2020-08-19 DIAGNOSIS — N6489 Other specified disorders of breast: Secondary | ICD-10-CM | POA: Diagnosis not present

## 2020-08-19 DIAGNOSIS — R922 Inconclusive mammogram: Secondary | ICD-10-CM | POA: Diagnosis not present

## 2020-12-13 DIAGNOSIS — Z6822 Body mass index (BMI) 22.0-22.9, adult: Secondary | ICD-10-CM | POA: Diagnosis not present

## 2020-12-13 DIAGNOSIS — N390 Urinary tract infection, site not specified: Secondary | ICD-10-CM | POA: Diagnosis not present

## 2020-12-13 DIAGNOSIS — M810 Age-related osteoporosis without current pathological fracture: Secondary | ICD-10-CM | POA: Diagnosis not present

## 2021-01-14 DIAGNOSIS — R030 Elevated blood-pressure reading, without diagnosis of hypertension: Secondary | ICD-10-CM | POA: Diagnosis not present

## 2021-01-14 DIAGNOSIS — N1831 Chronic kidney disease, stage 3a: Secondary | ICD-10-CM | POA: Diagnosis not present

## 2021-01-14 DIAGNOSIS — H6980 Other specified disorders of Eustachian tube, unspecified ear: Secondary | ICD-10-CM | POA: Diagnosis not present

## 2021-01-14 DIAGNOSIS — M549 Dorsalgia, unspecified: Secondary | ICD-10-CM | POA: Diagnosis not present

## 2021-02-02 DIAGNOSIS — Z23 Encounter for immunization: Secondary | ICD-10-CM | POA: Diagnosis not present

## 2021-03-04 DIAGNOSIS — R300 Dysuria: Secondary | ICD-10-CM | POA: Diagnosis not present

## 2021-03-17 DIAGNOSIS — Z0000 Encounter for general adult medical examination without abnormal findings: Secondary | ICD-10-CM | POA: Diagnosis not present

## 2021-03-17 DIAGNOSIS — N1831 Chronic kidney disease, stage 3a: Secondary | ICD-10-CM | POA: Diagnosis not present

## 2021-03-17 DIAGNOSIS — E7800 Pure hypercholesterolemia, unspecified: Secondary | ICD-10-CM | POA: Diagnosis not present

## 2021-03-17 DIAGNOSIS — M810 Age-related osteoporosis without current pathological fracture: Secondary | ICD-10-CM | POA: Diagnosis not present

## 2021-04-18 DIAGNOSIS — H40033 Anatomical narrow angle, bilateral: Secondary | ICD-10-CM | POA: Diagnosis not present

## 2021-04-18 DIAGNOSIS — H00021 Hordeolum internum right upper eyelid: Secondary | ICD-10-CM | POA: Diagnosis not present

## 2021-12-29 DIAGNOSIS — H00021 Hordeolum internum right upper eyelid: Secondary | ICD-10-CM | POA: Diagnosis not present

## 2021-12-29 DIAGNOSIS — R051 Acute cough: Secondary | ICD-10-CM | POA: Diagnosis not present

## 2021-12-29 DIAGNOSIS — Z20822 Contact with and (suspected) exposure to covid-19: Secondary | ICD-10-CM | POA: Diagnosis not present

## 2021-12-29 DIAGNOSIS — J029 Acute pharyngitis, unspecified: Secondary | ICD-10-CM | POA: Diagnosis not present

## 2022-03-21 DIAGNOSIS — H2513 Age-related nuclear cataract, bilateral: Secondary | ICD-10-CM | POA: Diagnosis not present

## 2022-03-21 DIAGNOSIS — H01009 Unspecified blepharitis unspecified eye, unspecified eyelid: Secondary | ICD-10-CM | POA: Diagnosis not present

## 2022-03-21 DIAGNOSIS — H16293 Other keratoconjunctivitis, bilateral: Secondary | ICD-10-CM | POA: Diagnosis not present

## 2022-03-22 DIAGNOSIS — Z6822 Body mass index (BMI) 22.0-22.9, adult: Secondary | ICD-10-CM | POA: Diagnosis not present

## 2022-03-22 DIAGNOSIS — M810 Age-related osteoporosis without current pathological fracture: Secondary | ICD-10-CM | POA: Diagnosis not present

## 2022-03-22 DIAGNOSIS — N1831 Chronic kidney disease, stage 3a: Secondary | ICD-10-CM | POA: Diagnosis not present

## 2022-03-22 DIAGNOSIS — N390 Urinary tract infection, site not specified: Secondary | ICD-10-CM | POA: Diagnosis not present

## 2022-03-22 DIAGNOSIS — Z0000 Encounter for general adult medical examination without abnormal findings: Secondary | ICD-10-CM | POA: Diagnosis not present

## 2022-03-22 DIAGNOSIS — E7800 Pure hypercholesterolemia, unspecified: Secondary | ICD-10-CM | POA: Diagnosis not present

## 2022-03-22 DIAGNOSIS — E559 Vitamin D deficiency, unspecified: Secondary | ICD-10-CM | POA: Diagnosis not present

## 2022-03-30 DIAGNOSIS — H01009 Unspecified blepharitis unspecified eye, unspecified eyelid: Secondary | ICD-10-CM | POA: Diagnosis not present

## 2022-03-30 DIAGNOSIS — H18593 Other hereditary corneal dystrophies, bilateral: Secondary | ICD-10-CM | POA: Diagnosis not present

## 2022-03-30 DIAGNOSIS — H16293 Other keratoconjunctivitis, bilateral: Secondary | ICD-10-CM | POA: Diagnosis not present

## 2022-04-20 DIAGNOSIS — R197 Diarrhea, unspecified: Secondary | ICD-10-CM | POA: Diagnosis not present

## 2022-04-20 DIAGNOSIS — R509 Fever, unspecified: Secondary | ICD-10-CM | POA: Diagnosis not present

## 2022-04-20 DIAGNOSIS — H5711 Ocular pain, right eye: Secondary | ICD-10-CM | POA: Diagnosis not present

## 2022-04-20 DIAGNOSIS — U071 COVID-19: Secondary | ICD-10-CM | POA: Diagnosis not present

## 2022-05-05 IMAGING — MG MM DIGITAL DIAGNOSTIC UNILAT*R* W/ TOMO W/ CAD
4 series · 4 of 12 positions shown · non-contrast
Comparison: Previous exam(s).

CLINICAL DATA: Screening recall for a right breast asymmetry.

EXAM:
DIGITAL DIAGNOSTIC UNILATERAL RIGHT MAMMOGRAM WITH TOMOSYNTHESIS AND
CAD; ULTRASOUND RIGHT BREAST LIMITED
TECHNIQUE: Right digital diagnostic mammography and breast tomosynthesis was
performed. The images were evaluated with computer-aided detection.;
Targeted ultrasound examination of the right breast was performed

[R MLO synth-2D]
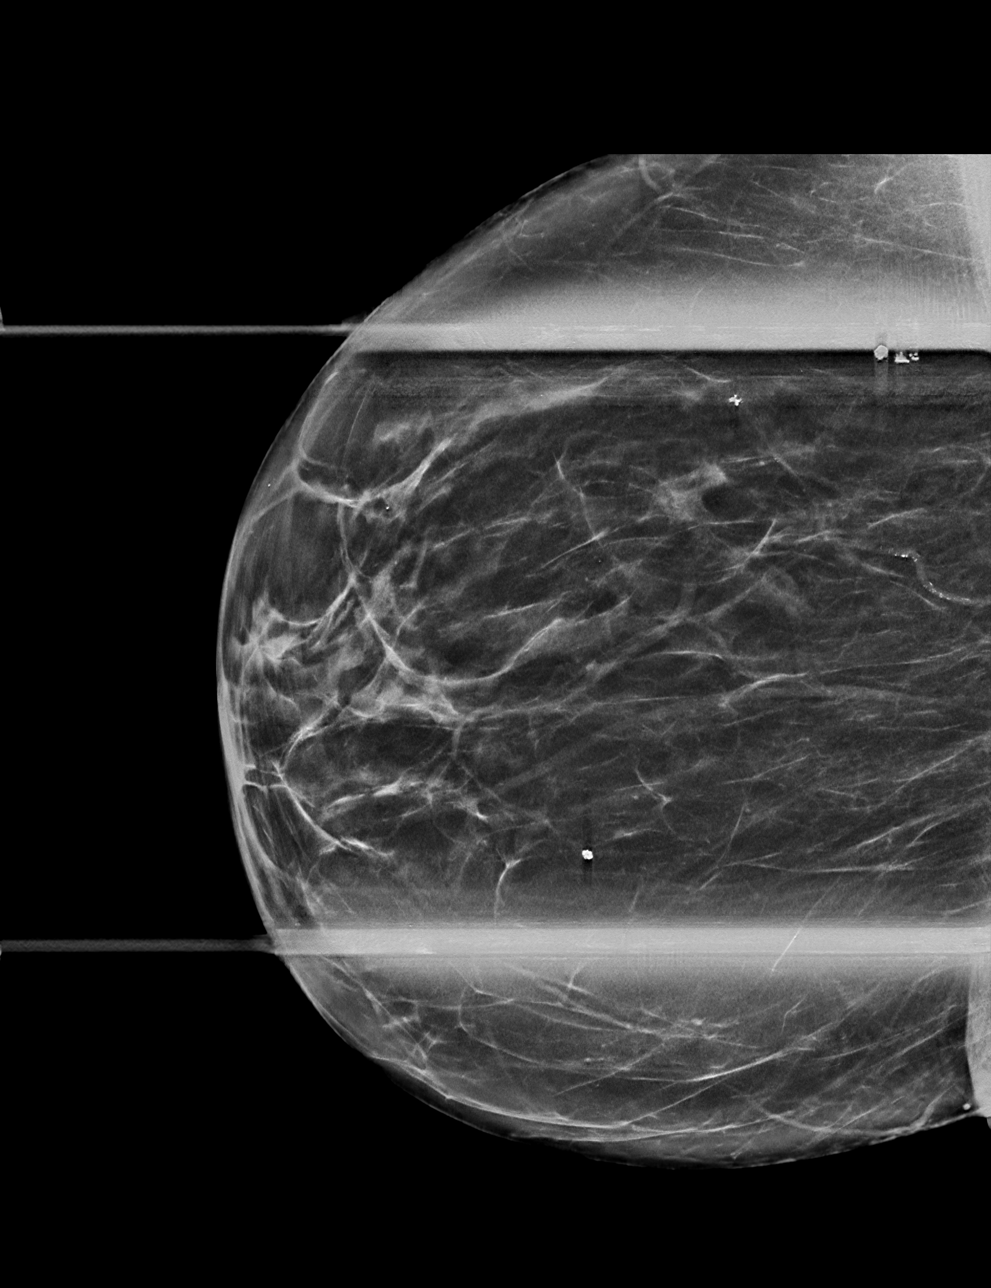

[R ML synth-2D]
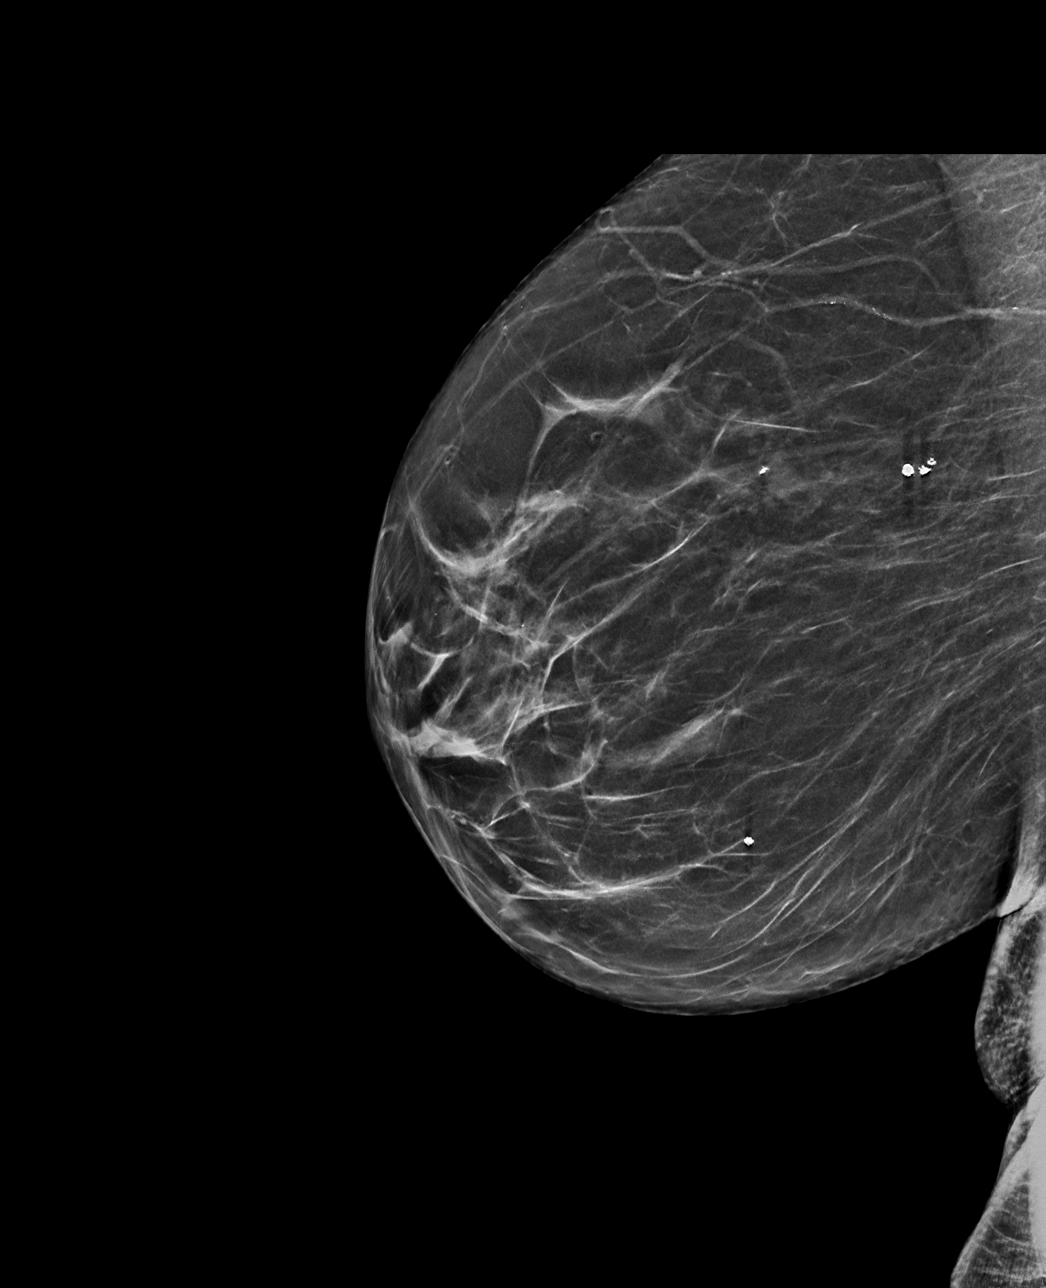

[R ML tomo · tomo slice 31/61.0]
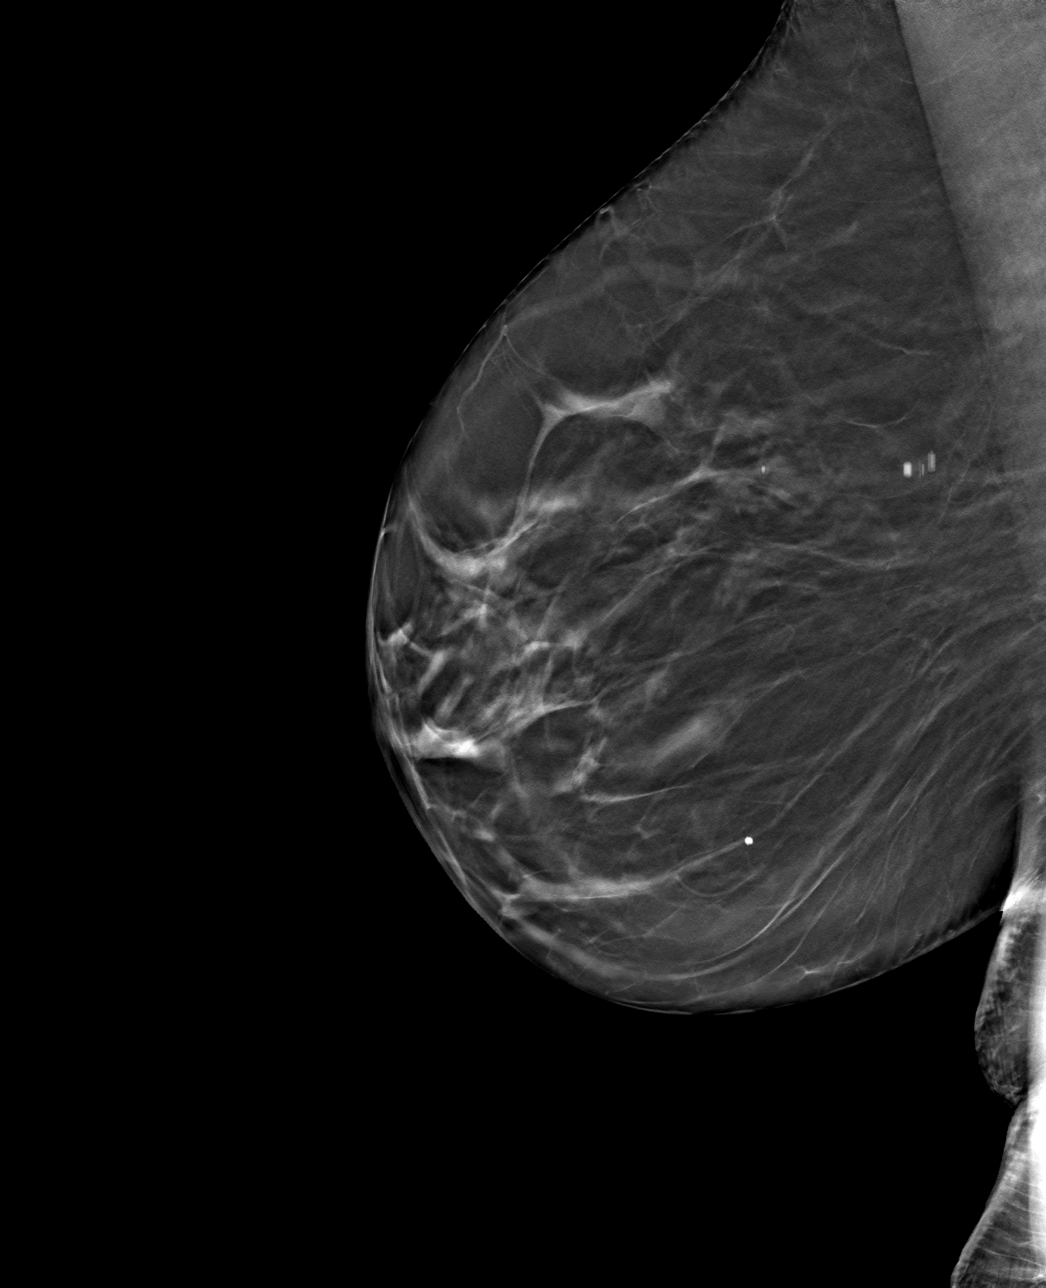

[R MLO tomo · tomo slice 31/61.0]
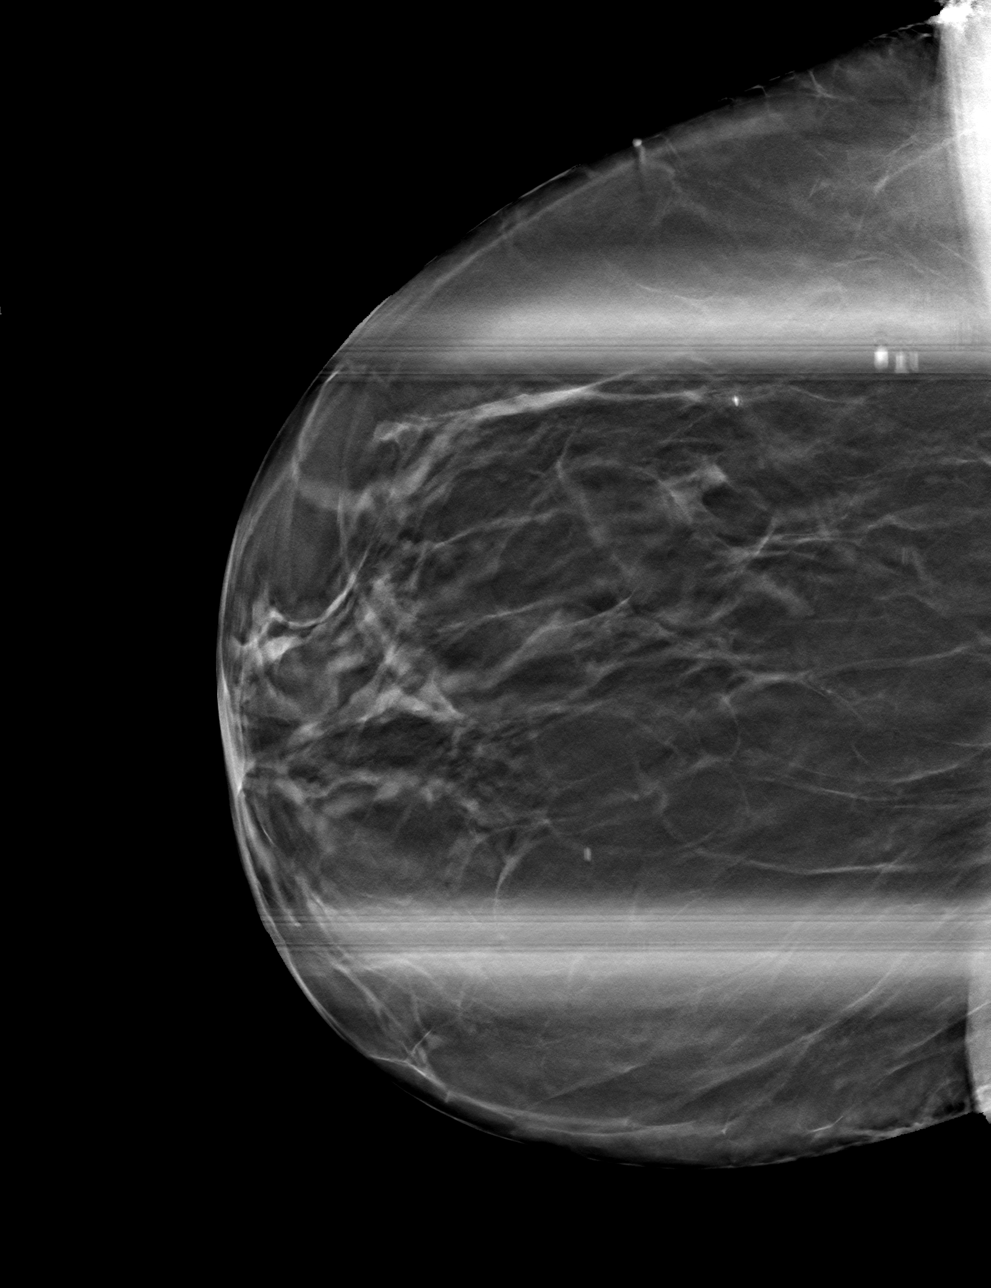

[4 of 12 positions shown; findings below may reference images not displayed]

ACR Breast Density Category b: There are scattered areas of
fibroglandular density.
FINDINGS: Spot compression tomosynthesis images through the lateral middle
depth of the right breast demonstrates a persistent asymmetry,
however this has a stable appearance compared to the patient's prior
mammogram from 8464.

Ultrasound targeted to the lateral to lower outer right breast
demonstrates no suspicious masses or areas of shadowing.
IMPRESSION: No persistent suspicious mammographic or targeted sonographic
findings in the lateral right breast.

RECOMMENDATION:
Screening mammogram in one year.(Code:UD-I-RDK)

I have discussed the findings and recommendations with the patient.
If applicable, a reminder letter will be sent to the patient
regarding the next appointment.

BI-RADS CATEGORY  1: Negative.

## 2022-05-17 DIAGNOSIS — H2513 Age-related nuclear cataract, bilateral: Secondary | ICD-10-CM | POA: Diagnosis not present

## 2022-05-17 DIAGNOSIS — H04123 Dry eye syndrome of bilateral lacrimal glands: Secondary | ICD-10-CM | POA: Diagnosis not present

## 2022-05-17 DIAGNOSIS — H5711 Ocular pain, right eye: Secondary | ICD-10-CM | POA: Diagnosis not present

## 2022-05-17 DIAGNOSIS — H18593 Other hereditary corneal dystrophies, bilateral: Secondary | ICD-10-CM | POA: Diagnosis not present

## 2022-06-06 DIAGNOSIS — H18593 Other hereditary corneal dystrophies, bilateral: Secondary | ICD-10-CM | POA: Diagnosis not present

## 2022-06-06 DIAGNOSIS — H2513 Age-related nuclear cataract, bilateral: Secondary | ICD-10-CM | POA: Diagnosis not present

## 2022-06-06 DIAGNOSIS — H5711 Ocular pain, right eye: Secondary | ICD-10-CM | POA: Diagnosis not present

## 2022-06-20 DIAGNOSIS — H5711 Ocular pain, right eye: Secondary | ICD-10-CM | POA: Diagnosis not present

## 2022-06-20 DIAGNOSIS — H18593 Other hereditary corneal dystrophies, bilateral: Secondary | ICD-10-CM | POA: Diagnosis not present

## 2022-06-20 DIAGNOSIS — H04123 Dry eye syndrome of bilateral lacrimal glands: Secondary | ICD-10-CM | POA: Diagnosis not present

## 2022-06-20 DIAGNOSIS — H2513 Age-related nuclear cataract, bilateral: Secondary | ICD-10-CM | POA: Diagnosis not present

## 2022-06-20 DIAGNOSIS — H18451 Nodular corneal degeneration, right eye: Secondary | ICD-10-CM | POA: Diagnosis not present

## 2022-07-03 DIAGNOSIS — H2513 Age-related nuclear cataract, bilateral: Secondary | ICD-10-CM | POA: Diagnosis not present

## 2022-07-03 DIAGNOSIS — H18451 Nodular corneal degeneration, right eye: Secondary | ICD-10-CM | POA: Diagnosis not present

## 2022-07-03 DIAGNOSIS — H04123 Dry eye syndrome of bilateral lacrimal glands: Secondary | ICD-10-CM | POA: Diagnosis not present

## 2022-07-03 DIAGNOSIS — H18593 Other hereditary corneal dystrophies, bilateral: Secondary | ICD-10-CM | POA: Diagnosis not present

## 2022-07-03 DIAGNOSIS — H5711 Ocular pain, right eye: Secondary | ICD-10-CM | POA: Diagnosis not present

## 2022-08-22 DIAGNOSIS — H18451 Nodular corneal degeneration, right eye: Secondary | ICD-10-CM | POA: Diagnosis not present

## 2022-08-22 DIAGNOSIS — H18593 Other hereditary corneal dystrophies, bilateral: Secondary | ICD-10-CM | POA: Diagnosis not present

## 2022-08-22 DIAGNOSIS — H04123 Dry eye syndrome of bilateral lacrimal glands: Secondary | ICD-10-CM | POA: Diagnosis not present

## 2022-08-22 DIAGNOSIS — H5711 Ocular pain, right eye: Secondary | ICD-10-CM | POA: Diagnosis not present

## 2022-09-04 DIAGNOSIS — H18593 Other hereditary corneal dystrophies, bilateral: Secondary | ICD-10-CM | POA: Diagnosis not present

## 2022-09-04 DIAGNOSIS — H18451 Nodular corneal degeneration, right eye: Secondary | ICD-10-CM | POA: Diagnosis not present

## 2022-09-04 DIAGNOSIS — H5711 Ocular pain, right eye: Secondary | ICD-10-CM | POA: Diagnosis not present

## 2022-09-04 DIAGNOSIS — H04123 Dry eye syndrome of bilateral lacrimal glands: Secondary | ICD-10-CM | POA: Diagnosis not present

## 2022-12-11 DIAGNOSIS — N390 Urinary tract infection, site not specified: Secondary | ICD-10-CM | POA: Diagnosis not present

## 2022-12-11 DIAGNOSIS — M549 Dorsalgia, unspecified: Secondary | ICD-10-CM | POA: Diagnosis not present

## 2022-12-18 DIAGNOSIS — N390 Urinary tract infection, site not specified: Secondary | ICD-10-CM | POA: Diagnosis not present

## 2023-03-06 DIAGNOSIS — H2513 Age-related nuclear cataract, bilateral: Secondary | ICD-10-CM | POA: Diagnosis not present

## 2023-03-06 DIAGNOSIS — H04123 Dry eye syndrome of bilateral lacrimal glands: Secondary | ICD-10-CM | POA: Diagnosis not present

## 2023-03-06 DIAGNOSIS — H5711 Ocular pain, right eye: Secondary | ICD-10-CM | POA: Diagnosis not present

## 2023-03-06 DIAGNOSIS — H18451 Nodular corneal degeneration, right eye: Secondary | ICD-10-CM | POA: Diagnosis not present

## 2023-03-06 DIAGNOSIS — H18593 Other hereditary corneal dystrophies, bilateral: Secondary | ICD-10-CM | POA: Diagnosis not present

## 2023-05-11 DIAGNOSIS — Z0000 Encounter for general adult medical examination without abnormal findings: Secondary | ICD-10-CM | POA: Diagnosis not present

## 2023-05-11 DIAGNOSIS — M810 Age-related osteoporosis without current pathological fracture: Secondary | ICD-10-CM | POA: Diagnosis not present

## 2023-05-11 DIAGNOSIS — E559 Vitamin D deficiency, unspecified: Secondary | ICD-10-CM | POA: Diagnosis not present

## 2023-05-11 DIAGNOSIS — N1831 Chronic kidney disease, stage 3a: Secondary | ICD-10-CM | POA: Diagnosis not present

## 2023-05-11 DIAGNOSIS — Z6821 Body mass index (BMI) 21.0-21.9, adult: Secondary | ICD-10-CM | POA: Diagnosis not present

## 2023-05-11 DIAGNOSIS — E7800 Pure hypercholesterolemia, unspecified: Secondary | ICD-10-CM | POA: Diagnosis not present

## 2023-05-15 ENCOUNTER — Other Ambulatory Visit: Payer: Self-pay | Admitting: Family Medicine

## 2023-05-15 DIAGNOSIS — E559 Vitamin D deficiency, unspecified: Secondary | ICD-10-CM

## 2023-05-21 DIAGNOSIS — H18451 Nodular corneal degeneration, right eye: Secondary | ICD-10-CM | POA: Diagnosis not present

## 2023-05-21 DIAGNOSIS — H5711 Ocular pain, right eye: Secondary | ICD-10-CM | POA: Diagnosis not present

## 2023-05-21 DIAGNOSIS — H04123 Dry eye syndrome of bilateral lacrimal glands: Secondary | ICD-10-CM | POA: Diagnosis not present

## 2023-05-21 DIAGNOSIS — H18593 Other hereditary corneal dystrophies, bilateral: Secondary | ICD-10-CM | POA: Diagnosis not present

## 2023-07-16 DIAGNOSIS — H18593 Other hereditary corneal dystrophies, bilateral: Secondary | ICD-10-CM | POA: Diagnosis not present

## 2023-07-16 DIAGNOSIS — H04123 Dry eye syndrome of bilateral lacrimal glands: Secondary | ICD-10-CM | POA: Diagnosis not present

## 2023-07-16 DIAGNOSIS — H18453 Nodular corneal degeneration, bilateral: Secondary | ICD-10-CM | POA: Diagnosis not present

## 2023-07-16 DIAGNOSIS — H2513 Age-related nuclear cataract, bilateral: Secondary | ICD-10-CM | POA: Diagnosis not present

## 2023-09-24 DIAGNOSIS — H18523 Epithelial (juvenile) corneal dystrophy, bilateral: Secondary | ICD-10-CM | POA: Diagnosis not present

## 2023-09-24 DIAGNOSIS — H18453 Nodular corneal degeneration, bilateral: Secondary | ICD-10-CM | POA: Diagnosis not present

## 2023-09-24 DIAGNOSIS — H2513 Age-related nuclear cataract, bilateral: Secondary | ICD-10-CM | POA: Diagnosis not present

## 2023-09-24 DIAGNOSIS — H04129 Dry eye syndrome of unspecified lacrimal gland: Secondary | ICD-10-CM | POA: Diagnosis not present

## 2023-12-31 DIAGNOSIS — H2513 Age-related nuclear cataract, bilateral: Secondary | ICD-10-CM | POA: Diagnosis not present

## 2023-12-31 DIAGNOSIS — H04123 Dry eye syndrome of bilateral lacrimal glands: Secondary | ICD-10-CM | POA: Diagnosis not present

## 2023-12-31 DIAGNOSIS — H18453 Nodular corneal degeneration, bilateral: Secondary | ICD-10-CM | POA: Diagnosis not present

## 2023-12-31 DIAGNOSIS — H18523 Epithelial (juvenile) corneal dystrophy, bilateral: Secondary | ICD-10-CM | POA: Diagnosis not present

## 2024-01-07 DIAGNOSIS — Z9889 Other specified postprocedural states: Secondary | ICD-10-CM | POA: Diagnosis not present
